# Patient Record
Sex: Female | Born: 1965 | Race: Black or African American | Hispanic: No | Smoking: Never smoker
Health system: Southern US, Community
[De-identification: ages and names within clinical notes are randomized; demographics above are authoritative.]

## PROBLEM LIST (undated history)

## (undated) DIAGNOSIS — I10 Essential (primary) hypertension: Secondary | ICD-10-CM

## (undated) HISTORY — PX: TUBAL LIGATION: SHX77

---

## 2008-04-18 ENCOUNTER — Ambulatory Visit: Payer: Self-pay | Admitting: Family Medicine

## 2011-05-26 ENCOUNTER — Ambulatory Visit: Payer: Self-pay | Admitting: Family Medicine

## 2011-06-09 ENCOUNTER — Ambulatory Visit: Payer: Self-pay | Admitting: Family Medicine

## 2013-03-13 ENCOUNTER — Ambulatory Visit: Payer: Self-pay | Admitting: Internal Medicine

## 2013-03-30 ENCOUNTER — Ambulatory Visit: Payer: Self-pay | Admitting: Internal Medicine

## 2013-04-11 ENCOUNTER — Ambulatory Visit: Payer: Self-pay | Admitting: Internal Medicine

## 2013-04-11 HISTORY — PX: BREAST CYST ASPIRATION: SHX578

## 2014-11-08 DIAGNOSIS — I10 Essential (primary) hypertension: Secondary | ICD-10-CM | POA: Insufficient documentation

## 2014-11-11 ENCOUNTER — Other Ambulatory Visit: Payer: Self-pay | Admitting: Internal Medicine

## 2014-11-11 DIAGNOSIS — Z1239 Encounter for other screening for malignant neoplasm of breast: Secondary | ICD-10-CM

## 2014-11-19 ENCOUNTER — Ambulatory Visit: Payer: Self-pay

## 2015-01-31 ENCOUNTER — Ambulatory Visit
Admission: RE | Admit: 2015-01-31 | Discharge: 2015-01-31 | Disposition: A | Discharge status: Home / Self Care | Payer: Self-pay | Source: Ambulatory Visit | Attending: Internal Medicine | Admitting: Internal Medicine

## 2015-01-31 DIAGNOSIS — Z1239 Encounter for other screening for malignant neoplasm of breast: Secondary | ICD-10-CM

## 2015-02-06 ENCOUNTER — Other Ambulatory Visit: Payer: Self-pay | Admitting: Internal Medicine

## 2015-02-06 DIAGNOSIS — N63 Unspecified lump in unspecified breast: Secondary | ICD-10-CM

## 2015-02-28 ENCOUNTER — Ambulatory Visit
Admission: RE | Admit: 2015-02-28 | Discharge: 2015-02-28 | Disposition: A | Discharge status: Home / Self Care | Payer: Self-pay | Source: Ambulatory Visit | Attending: Internal Medicine | Admitting: Internal Medicine

## 2015-02-28 DIAGNOSIS — N63 Unspecified lump in unspecified breast: Secondary | ICD-10-CM

## 2016-03-03 ENCOUNTER — Ambulatory Visit: Payer: Self-pay | Attending: Oncology | Admitting: *Deleted

## 2016-03-03 ENCOUNTER — Encounter: Payer: Self-pay | Admitting: *Deleted

## 2016-03-03 ENCOUNTER — Encounter (INDEPENDENT_AMBULATORY_CARE_PROVIDER_SITE_OTHER): Payer: Self-pay

## 2016-03-03 VITALS — BP 144/91 | HR 82 | Temp 97.8°F | Ht 66.14 in | Wt 194.4 lb

## 2016-03-03 DIAGNOSIS — N63 Unspecified lump in unspecified breast: Secondary | ICD-10-CM

## 2016-03-03 NOTE — Progress Notes (Signed)
Subjective:     Patient ID: Eileen Hill, female   DOB: 02/12/1966, 50 y.o.   MRN: [id number]  HPI   Review of Systems     Objective:   Physical Exam  Pulmonary/Chest: Right breast exhibits no inverted nipple, no mass, no nipple discharge, no skin change and no tenderness. Left breast exhibits mass. Left breast exhibits no inverted nipple, no nipple discharge, no skin change and no tenderness. Breasts are symmetrical.         Assessment:     50 year old Black female presents today for clinical breast exam, pap and mammogram.  Last pap on 11/12/14 was negative / negative, therefore explained that we could do a pelvic exam, but per guidelines, next pap smear will be due in 2021.  Patient refused pelvic exam, and states she will get a pap with her primary care provider.  Clinical breast exam revealed an asymmetric thickening at 1:00 left breast.  Patient states she has had a cyst aspirated there before.  Taught self breast awareness.  Patient has been screened for eligibility.  She does not have any insurance, Medicare or Medicaid.  She also meets financial eligibility.  Hand-out given on the Affordable Care Act.    Plan:     Bilateral diagnostic mammogram and ultrasound scheduled for Friday, March 05, 2016.  Will follow-up per [facility] protocol.

## 2016-03-03 NOTE — Patient Instructions (Signed)
Gave patient hand-out, Women Staying Healthy, Active and Well from [facility], with education on breast health, pap smears, heart and colon health. 

## 2016-03-05 ENCOUNTER — Ambulatory Visit
Admission: RE | Admit: 2016-03-05 | Discharge: 2016-03-05 | Disposition: A | Discharge status: Home / Self Care | Payer: Self-pay | Source: Ambulatory Visit | Attending: Oncology | Admitting: Oncology

## 2016-03-05 DIAGNOSIS — N63 Unspecified lump in unspecified breast: Secondary | ICD-10-CM

## 2016-03-11 ENCOUNTER — Telehealth: Payer: Self-pay | Admitting: *Deleted

## 2016-03-11 NOTE — Telephone Encounter (Signed)
Called and talked with patient today.  Discussed the benign findings on her mammogram.  Denies any pain.  Offered surgical consult for aspiration of the cysts if at anytime she starts to experience pain.  She is agreeable.  HSIS to [place].

## 2017-02-11 ENCOUNTER — Other Ambulatory Visit: Payer: Self-pay | Admitting: Internal Medicine

## 2017-02-11 DIAGNOSIS — Z1231 Encounter for screening mammogram for malignant neoplasm of breast: Secondary | ICD-10-CM

## 2017-03-11 ENCOUNTER — Ambulatory Visit
Admission: RE | Admit: 2017-03-11 | Discharge: 2017-03-11 | Disposition: A | Discharge status: Home / Self Care | Payer: BLUE CROSS/BLUE SHIELD | Source: Ambulatory Visit | Attending: Internal Medicine | Admitting: Internal Medicine

## 2017-03-11 DIAGNOSIS — Z1231 Encounter for screening mammogram for malignant neoplasm of breast: Secondary | ICD-10-CM | POA: Insufficient documentation

## 2017-06-15 ENCOUNTER — Other Ambulatory Visit: Payer: Self-pay | Admitting: Nurse Practitioner

## 2017-06-15 DIAGNOSIS — Z1239 Encounter for other screening for malignant neoplasm of breast: Secondary | ICD-10-CM

## 2017-06-20 ENCOUNTER — Telehealth: Payer: Self-pay | Admitting: Gastroenterology

## 2017-06-20 NOTE — Telephone Encounter (Signed)
Gastroenterology Pre-Procedure Review  Request Date:   Requesting Physician: Dr.    PATIENT REVIEW QUESTIONS: The patient responded to the following health history questions as indicated:    1. Are you having any GI issues? no 2. Do you have a personal history of Polyps? no 3. Do you have a family history of Colon Cancer or Polyps? yes (father) 4. Diabetes Mellitus? no 5. Joint replacements in the past 12 months?no 6. Major health problems in the past 3 months?no 7. Any artificial heart valves, MVP, or defibrillator?no    MEDICATIONS & ALLERGIES:    Patient reports the following regarding taking any anticoagulation/antiplatelet therapy:   Plavix, Coumadin, Eliquis, Xarelto, Lovenox, Pradaxa, Brilinta, or Effient? no Aspirin? no  Patient confirms/reports the following medications:  No current outpatient medications on file.   No current facility-administered medications for this visit.     Patient confirms/reports the following allergies:  No Known Allergies  No orders of the defined types were placed in this encounter.   AUTHORIZATION INFORMATION Primary Insurance: 1D#: Group #:  Secondary Insurance: 1D#: Group #:  SCHEDULE INFORMATION: Date:  Time: Location:

## 2017-06-23 ENCOUNTER — Other Ambulatory Visit: Payer: Self-pay

## 2017-06-23 DIAGNOSIS — Z1211 Encounter for screening for malignant neoplasm of colon: Secondary | ICD-10-CM

## 2017-06-23 MED ORDER — PEG 3350-KCL-NA BICARB-NACL 420 G PO SOLR: 4000.0000 mL | Freq: Once | ORAL | 0 refills | Status: AC

## 2018-01-05 ENCOUNTER — Encounter: Payer: Self-pay | Admitting: *Deleted

## 2018-01-06 ENCOUNTER — Ambulatory Visit: Payer: BLUE CROSS/BLUE SHIELD | Admitting: Anesthesiology

## 2018-01-06 ENCOUNTER — Encounter: Payer: Self-pay | Admitting: *Deleted

## 2018-01-06 ENCOUNTER — Ambulatory Visit
Admission: RE | Admit: 2018-01-06 | Discharge: 2018-01-06 | Disposition: A | Discharge status: Home / Self Care | Payer: BLUE CROSS/BLUE SHIELD | Source: Ambulatory Visit | Attending: Gastroenterology | Admitting: Gastroenterology

## 2018-01-06 ENCOUNTER — Encounter
Admission: RE | Disposition: A | Discharge status: Home / Self Care | Payer: Self-pay | Source: Ambulatory Visit | Attending: Gastroenterology

## 2018-01-06 DIAGNOSIS — Z1211 Encounter for screening for malignant neoplasm of colon: Secondary | ICD-10-CM | POA: Insufficient documentation

## 2018-01-06 DIAGNOSIS — K219 Gastro-esophageal reflux disease without esophagitis: Secondary | ICD-10-CM | POA: Insufficient documentation

## 2018-01-06 DIAGNOSIS — Z79899 Other long term (current) drug therapy: Secondary | ICD-10-CM | POA: Diagnosis not present

## 2018-01-06 DIAGNOSIS — Z882 Allergy status to sulfonamides status: Secondary | ICD-10-CM | POA: Diagnosis not present

## 2018-01-06 DIAGNOSIS — I10 Essential (primary) hypertension: Secondary | ICD-10-CM | POA: Diagnosis not present

## 2018-01-06 HISTORY — PX: COLONOSCOPY WITH PROPOFOL: SHX5780

## 2018-01-06 HISTORY — DX: Essential (primary) hypertension: I10

## 2018-01-06 LAB — POCT PREGNANCY, URINE: PREG TEST UR: NEGATIVE

## 2018-01-06 SURGERY — COLONOSCOPY WITH PROPOFOL
Anesthesia: General

## 2018-01-06 MED ORDER — PROPOFOL 10 MG/ML IV BOLUS
INTRAVENOUS | Status: DC | PRN
  Administered 2018-01-06: 10 mg via INTRAVENOUS
  Administered 2018-01-06: 30 mg via INTRAVENOUS
  Administered 2018-01-06: 90 mg via INTRAVENOUS
  Administered 2018-01-06: 30 mg via INTRAVENOUS

## 2018-01-06 MED ORDER — PROPOFOL 500 MG/50ML IV EMUL
INTRAVENOUS | Status: DC | PRN
  Administered 2018-01-06: 140 ug/kg/min via INTRAVENOUS

## 2018-01-06 MED ORDER — PROPOFOL 10 MG/ML IV BOLUS
INTRAVENOUS | Status: AC
  Filled 2018-01-06: qty 20

## 2018-01-06 MED ORDER — SODIUM CHLORIDE 0.9 % IV SOLN
INTRAVENOUS | Status: DC
  Administered 2018-01-06: 1000 mL via INTRAVENOUS

## 2018-01-06 MED ORDER — PHENYLEPHRINE HCL 10 MG/ML IJ SOLN
INTRAMUSCULAR | Status: AC
  Filled 2018-01-06: qty 1

## 2018-01-06 NOTE — Anesthesia Post-op Follow-up Note (Signed)
Anesthesia QCDR form completed.        

## 2018-01-06 NOTE — Anesthesia Postprocedure Evaluation (Signed)
Anesthesia Post Note  Patient: BRIDGET Attica  Procedure(s) Performed: COLONOSCOPY WITH PROPOFOL (N/A )  Patient location during evaluation: Endoscopy Anesthesia Type: General Level of consciousness: awake and alert Pain management: pain level controlled Vital Signs Assessment: post-procedure vital signs reviewed and stable Respiratory status: spontaneous breathing, nonlabored ventilation, respiratory function stable and patient connected to nasal cannula oxygen Cardiovascular status: blood pressure returned to baseline and stable Postop Assessment: no apparent nausea or vomiting Anesthetic complications: no     Last Vitals:  Vitals:   01/06/18 0856 01/06/18 0935  BP: 110/69 130/90  Pulse:  68  Resp:  16  Temp: 36.4 C   SpO2:  100%    Last Pain:  Vitals:   01/06/18 0935  TempSrc:   PainSc: 0-No pain                 Cleda Mccreedy Piscitello

## 2018-01-06 NOTE — H&P (Signed)
Melodie Bouillon, MD [address], [address], [place], [place], [address] [address], [address], [place], [place], [address] Phone: [phone]  Fax: [phone]  Primary Care Physician:  Martie Round, NP   Pre-Procedure History & Physical: HPI:  Eileen Hill is a 52 y.o. female is here for a colonoscopy.   Past Medical History:  Diagnosis Date  . Hypertension     Past Surgical History:  Procedure Laterality Date  . BREAST CYST ASPIRATION Left 04/11/2013   2:00 area  . TUBAL LIGATION      Prior to Admission medications   Medication Sig Start Date End Date Taking? Authorizing Provider  esomeprazole (NEXIUM) 20 MG capsule Take 20 mg by mouth daily at 12 noon.   Yes [provider]  verapamil (CALAN-SR) 240 MG CR tablet Take by mouth. 09/03/16   [provider]    Allergies as of 06/24/2017 - Review Complete 01/31/2015  Allergen Reaction Noted  . Sulfa antibiotics Nausea And Vomiting and Nausea Only 04/16/2014    Family History  Problem Relation Age of Onset  . Breast cancer Neg Hx     Social History   Socioeconomic History  . Marital status: Married    Spouse name: Not on file  . Number of children: Not on file  . Years of education: Not on file  . Highest education level: Not on file  Occupational History  . Not on file  Social Needs  . Financial resource strain: Not on file  . Food insecurity:    Worry: Not on file    Inability: Not on file  . Transportation needs:    Medical: Not on file    Non-medical: Not on file  Tobacco Use  . Smoking status: Never Smoker  . Smokeless tobacco: Never Used  Substance and Sexual Activity  . Alcohol use: Not on file  . Drug use: Not on file  . Sexual activity: Not on file  Lifestyle  . Physical activity:    Days per week: Not on file    Minutes per session: Not on file  . Stress: Not on file  Relationships  . Social connections:    Talks on phone: Not on file    Gets together: Not  on file    Attends religious service: Not on file    Active member of club or organization: Not on file    Attends meetings of clubs or organizations: Not on file    Relationship status: Not on file  . Intimate partner violence:    Fear of current or ex partner: Not on file    Emotionally abused: Not on file    Physically abused: Not on file    Forced sexual activity: Not on file  Other Topics Concern  . Not on file  Social History Narrative  . Not on file    Review of Systems: See HPI, otherwise negative ROS  Physical Exam: BP (!) 143/92   Pulse 63   Temp 97.7 F (36.5 C) (Tympanic)   Resp 18   SpO2 100%  General:   Alert,  pleasant and cooperative in NAD Head:  Normocephalic and atraumatic. Neck:  Supple; no masses or thyromegaly. Lungs:  Clear throughout to auscultation, normal respiratory effort.    Heart:  +S1, +S2, Regular rate and rhythm, No edema. Abdomen:  Soft, nontender and nondistended. Normal bowel sounds, without guarding, and without rebound.   Neurologic:  Alert and  oriented x4;  grossly normal neurologically.  Impression/Plan:  Eileen Hill is here for a colonoscopy to be performed for average risk screening.  Risks, benefits, limitations, and alternatives regarding  colonoscopy have been reviewed with the patient.  Questions have been answered.  All parties agreeable.   Pasty Spillers, MD  01/06/2018, 8:05 AM

## 2018-01-06 NOTE — Anesthesia Preprocedure Evaluation (Signed)
Anesthesia Evaluation  Patient identified by MRN, date of birth, ID band Patient awake    Reviewed: Allergy & Precautions, H&P , NPO status , Patient's Chart, lab work & pertinent test results  History of Anesthesia Complications Negative for: history of anesthetic complications  Airway Mallampati: II  TM Distance: >3 FB Neck ROM: full    Dental  (+) Chipped   Pulmonary neg pulmonary ROS, neg shortness of breath,           Cardiovascular hypertension, (-) angina(-) Past MI and (-) DOE      Neuro/Psych negative neurological ROS  negative psych ROS   GI/Hepatic Neg liver ROS, GERD  Medicated and Controlled,  Endo/Other  negative endocrine ROS  Renal/GU negative Renal ROS  negative genitourinary   Musculoskeletal   Abdominal   Peds  Hematology negative hematology ROS (+)   Anesthesia Other Findings Past Medical History: No date: Hypertension  Past Surgical History: 04/11/2013: BREAST CYST ASPIRATION; Left     Comment:  2:00 area No date: TUBAL LIGATION     Reproductive/Obstetrics negative OB ROS                             Anesthesia Physical Anesthesia Plan  ASA: III  Anesthesia Plan: General   Post-op Pain Management:    Induction: Intravenous  PONV Risk Score and Plan: Propofol infusion and TIVA  Airway Management Planned: Natural Airway and Nasal Cannula  Additional Equipment:   Intra-op Plan:   Post-operative Plan:   Informed Consent: I have reviewed the patients History and Physical, chart, labs and discussed the procedure including the risks, benefits and alternatives for the proposed anesthesia with the patient or authorized representative who has indicated his/her understanding and acceptance.   Dental Advisory Given  Plan Discussed with: Anesthesiologist, CRNA and Surgeon  Anesthesia Plan Comments: (Patient consented for risks of anesthesia including but not  limited to:  - adverse reactions to medications - risk of intubation if required - damage to teeth, lips or other oral mucosa - sore throat or hoarseness - Damage to heart, brain, lungs or loss of life  Patient voiced understanding.)        Anesthesia Quick Evaluation

## 2018-01-06 NOTE — Transfer of Care (Signed)
Immediate Anesthesia Transfer of Care Note  Patient: Eileen Hill  Procedure(s) Performed: COLONOSCOPY WITH PROPOFOL (N/A )  Patient Location: Endoscopy Unit  Anesthesia Type:General  Level of Consciousness: sedated  Airway & Oxygen Therapy: Patient Spontanous Breathing and Patient connected to nasal cannula oxygen  Post-op Assessment: Report given to RN and Post -op Vital signs reviewed and stable  Post vital signs: Reviewed and stable  Last Vitals:  Vitals Value Taken Time  BP    Temp    Pulse    Resp    SpO2 98 % 01/06/2018  8:55 AM    Last Pain:  Vitals:   01/06/18 0759  TempSrc: Tympanic  PainSc: 0-No pain         Complications: No apparent anesthesia complications

## 2018-01-06 NOTE — Op Note (Signed)
[facility] Gastroenterology Patient Name: Toniette Devera Procedure Date: 01/06/2018 7:08 AM MRN: [id number] Account #: [id number] Date of Birth: [birth year] Admit Type: Outpatient Age: 52 Room: [facility] ENDO ROOM 2 Gender: Female Note Status: Finalized Procedure:            Colonoscopy Indications:          Screening for colorectal malignant neoplasm Providers:            Dearra Myhand B. Maximino Greenland MD, MD Referring MD:         Clinic [facility], MD (Referring MD) Medicines:            Monitored Anesthesia Care Complications:        No immediate complications. Procedure:            Pre-Anesthesia Assessment:                       - Prior to the procedure, a History and Physical was                        performed, and patient medications, allergies and                        sensitivities were reviewed. The patient's tolerance of                        previous anesthesia was reviewed.                       - The risks and benefits of the procedure and the                        sedation options and risks were discussed with the                        patient. All questions were answered and informed                        consent was obtained.                       - Patient identification and proposed procedure were                        verified prior to the procedure by the physician, the                        nurse, the anesthetist and the technician. The                        procedure was verified in the pre-procedure area in the                        procedure room in the endoscopy suite.                       - ASA Grade Assessment: II - A patient with mild                        systemic disease.                       -  After reviewing the risks and benefits, the patient                        was deemed in satisfactory condition to undergo the                        procedure.                       After obtaining informed consent, the  colonoscope was                        passed under direct vision. Throughout the procedure,                        the patient's blood pressure, pulse, and oxygen                        saturations were monitored continuously. The                        Colonoscope was introduced through the anus and                        advanced to the the cecum, identified by appendiceal                        orifice and ileocecal valve. The colonoscopy was                        performed with ease. The patient tolerated the                        procedure well. The quality of the bowel preparation                        was fair except the sigmoid colon was poor. Water and                        suctioning used to clean the colon as much as possible.                        Solid stool could not be suctioned for visualization in                        some areas. Findings:      The perianal and digital rectal examinations were normal.      The rectum, sigmoid colon, descending colon, transverse colon, ascending       colon and cecum appeared normal.      The retroflexed view of the distal rectum and anal verge was normal and       showed no anal or rectal abnormalities. Impression:           - The rectum, sigmoid colon, descending colon,                        transverse colon, ascending colon and cecum are normal.                       - The distal rectum  and anal verge are normal on                        retroflexion view.                       - No specimens collected.                       - Due to the prep, this was not a satisfactory exam for                        colorectal cancer screening, that is evaluation for                        small or flat lesions or polyps. Patient should follow                        up in clinic after discharge to discuss need for future                        colonoscopy and colorectal cancer screening. Recommendation:       - Discharge patient to home.                        - Resume previous diet.                       - Continue present medications.                       - Repeat colonoscopy in 1 year due to fair to poor prep                        (Patient could not finish her entire prep due to                        nausea. Would recommend Suprep or slow prep for next                        procedure).                       - Return to primary care physician as previously                        scheduled.                       - The findings and recommendations were discussed with                        the patient.                       - The findings and recommendations were discussed with                        the patient's family. Procedure Code(s):    --- Professional ---                       Z6109, Colorectal cancer screening;  colonoscopy on                        individual not meeting criteria for high risk Diagnosis Code(s):    --- Professional ---                       Z12.11, Encounter for screening for malignant neoplasm                        of colon CPT copyright 2018 American Medical Association. All rights reserved. The codes documented in this report are preliminary and upon coder review may  be revised to meet current compliance requirements.  Melodie Bouillon, MD Michel Bickers B. Maximino Greenland MD, MD 01/06/2018 9:00:13 AM This report has been signed electronically. Number of Addenda: 0 Note Initiated On: 01/06/2018 7:08 AM Scope Withdrawal Time: 0 hours 21 minutes 46 seconds  Total Procedure Duration: 0 hours 30 minutes 53 seconds       [facility]

## 2018-03-20 ENCOUNTER — Ambulatory Visit
Admission: RE | Admit: 2018-03-20 | Discharge: 2018-03-20 | Disposition: A | Discharge status: Home / Self Care | Payer: BLUE CROSS/BLUE SHIELD | Source: Ambulatory Visit | Attending: Nurse Practitioner | Admitting: Nurse Practitioner

## 2018-03-20 DIAGNOSIS — Z1239 Encounter for other screening for malignant neoplasm of breast: Secondary | ICD-10-CM | POA: Insufficient documentation

## 2018-03-24 ENCOUNTER — Other Ambulatory Visit: Payer: Self-pay | Admitting: Nurse Practitioner

## 2018-03-24 DIAGNOSIS — N6489 Other specified disorders of breast: Secondary | ICD-10-CM

## 2018-03-24 DIAGNOSIS — R928 Other abnormal and inconclusive findings on diagnostic imaging of breast: Secondary | ICD-10-CM

## 2018-03-31 ENCOUNTER — Ambulatory Visit
Admission: RE | Admit: 2018-03-31 | Discharge: 2018-03-31 | Disposition: A | Discharge status: Home / Self Care | Payer: BLUE CROSS/BLUE SHIELD | Source: Ambulatory Visit | Attending: Nurse Practitioner | Admitting: Nurse Practitioner

## 2018-03-31 DIAGNOSIS — R928 Other abnormal and inconclusive findings on diagnostic imaging of breast: Secondary | ICD-10-CM

## 2018-03-31 DIAGNOSIS — N6489 Other specified disorders of breast: Secondary | ICD-10-CM | POA: Diagnosis present

## 2019-04-06 ENCOUNTER — Other Ambulatory Visit: Payer: Self-pay | Admitting: Family Medicine

## 2019-04-06 DIAGNOSIS — Z1231 Encounter for screening mammogram for malignant neoplasm of breast: Secondary | ICD-10-CM

## 2019-05-04 ENCOUNTER — Encounter: Payer: Self-pay | Admitting: Radiology

## 2019-05-04 ENCOUNTER — Ambulatory Visit
Admission: RE | Admit: 2019-05-04 | Discharge: 2019-05-04 | Disposition: A | Discharge status: Home / Self Care | Payer: BLUE CROSS/BLUE SHIELD | Source: Ambulatory Visit | Attending: Family Medicine | Admitting: Family Medicine

## 2019-05-04 DIAGNOSIS — Z1231 Encounter for screening mammogram for malignant neoplasm of breast: Secondary | ICD-10-CM | POA: Insufficient documentation

## 2019-05-07 ENCOUNTER — Other Ambulatory Visit: Payer: Self-pay | Admitting: Family Medicine

## 2019-05-07 DIAGNOSIS — R928 Other abnormal and inconclusive findings on diagnostic imaging of breast: Secondary | ICD-10-CM

## 2019-05-11 ENCOUNTER — Ambulatory Visit
Admission: RE | Admit: 2019-05-11 | Discharge: 2019-05-11 | Disposition: A | Discharge status: Home / Self Care | Payer: BLUE CROSS/BLUE SHIELD | Source: Ambulatory Visit | Attending: Family Medicine | Admitting: Family Medicine

## 2019-05-11 DIAGNOSIS — R928 Other abnormal and inconclusive findings on diagnostic imaging of breast: Secondary | ICD-10-CM | POA: Insufficient documentation

## 2019-05-15 ENCOUNTER — Other Ambulatory Visit: Payer: Self-pay | Admitting: Family Medicine

## 2019-05-15 DIAGNOSIS — R928 Other abnormal and inconclusive findings on diagnostic imaging of breast: Secondary | ICD-10-CM

## 2019-05-15 DIAGNOSIS — N6320 Unspecified lump in the left breast, unspecified quadrant: Secondary | ICD-10-CM

## 2019-05-25 ENCOUNTER — Ambulatory Visit
Admission: RE | Admit: 2019-05-25 | Discharge: 2019-05-25 | Disposition: A | Discharge status: Home / Self Care | Payer: BLUE CROSS/BLUE SHIELD | Source: Ambulatory Visit | Attending: Family Medicine | Admitting: Family Medicine

## 2019-05-25 DIAGNOSIS — N6320 Unspecified lump in the left breast, unspecified quadrant: Secondary | ICD-10-CM

## 2019-05-25 DIAGNOSIS — R928 Other abnormal and inconclusive findings on diagnostic imaging of breast: Secondary | ICD-10-CM

## 2019-05-25 HISTORY — PX: BREAST BIOPSY: SHX20

## 2019-05-28 LAB — SURGICAL PATHOLOGY

## 2019-10-13 IMAGING — MG DIGITAL DIAGNOSTIC UNILATERAL LEFT MAMMOGRAM WITH TOMO AND CAD
6 series · 6 of 18 positions shown · non-contrast
Comparison: Previous exam(s).

CLINICAL DATA: Left medial breast asymmetry seen on most recent
screening mammography.

EXAM:
DIGITAL DIAGNOSTIC LEFT MAMMOGRAM WITH CAD AND TOMO
ULTRASOUND LEFT BREAST

[L CC synth-2D (1 of 2)]
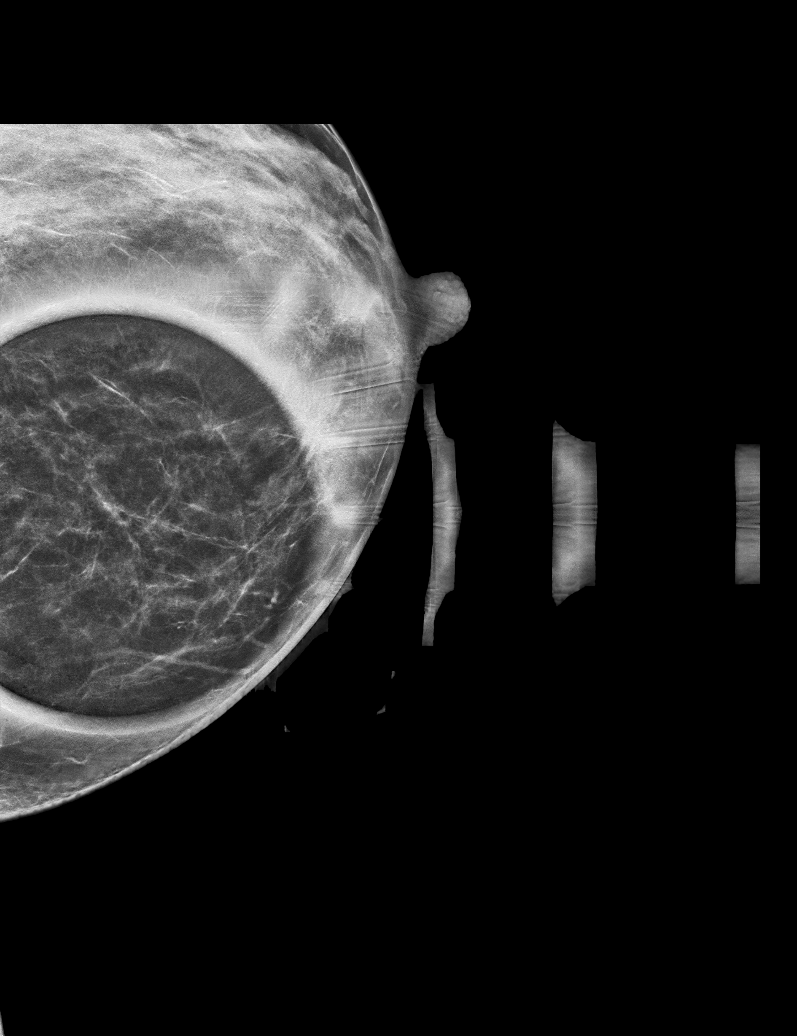

[L CC synth-2D (2 of 2)]
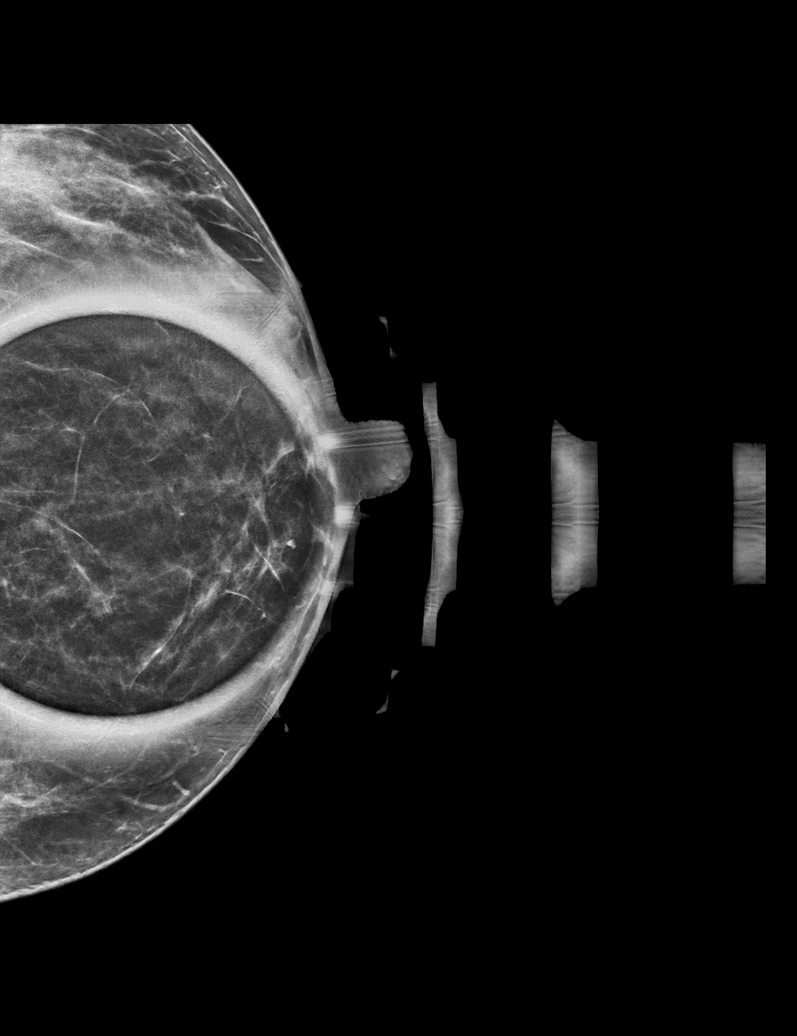

[L MLO synth-2D]
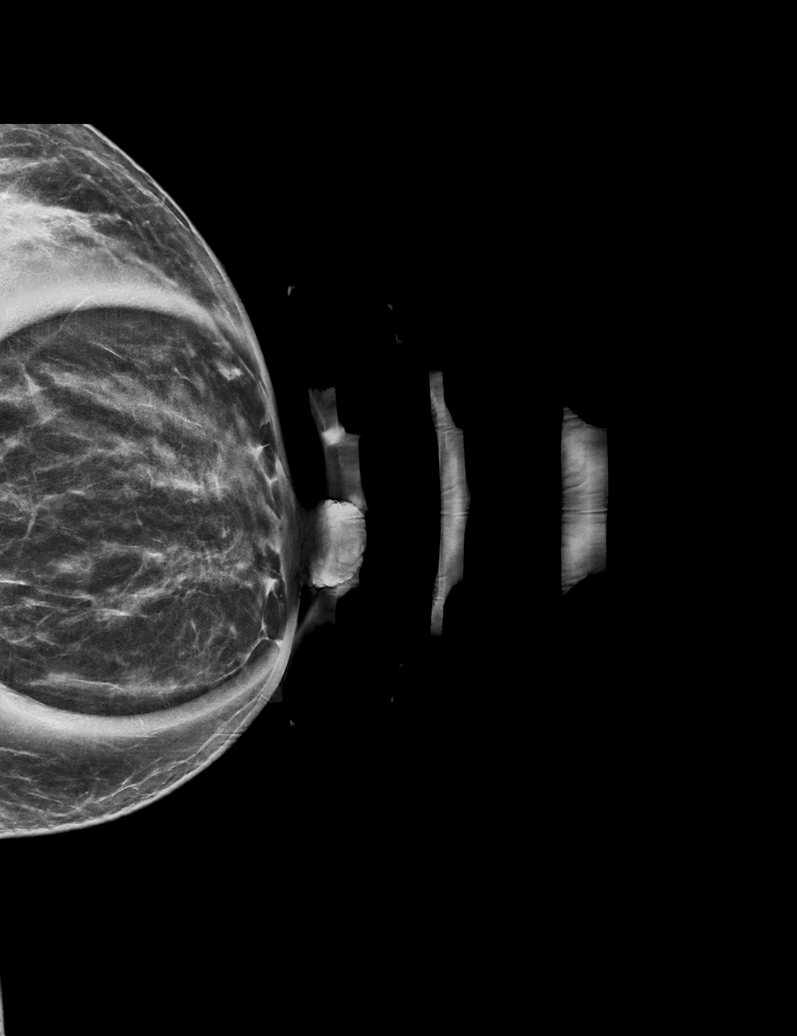

[L MLO tomo · tomo slice 31/62.0]
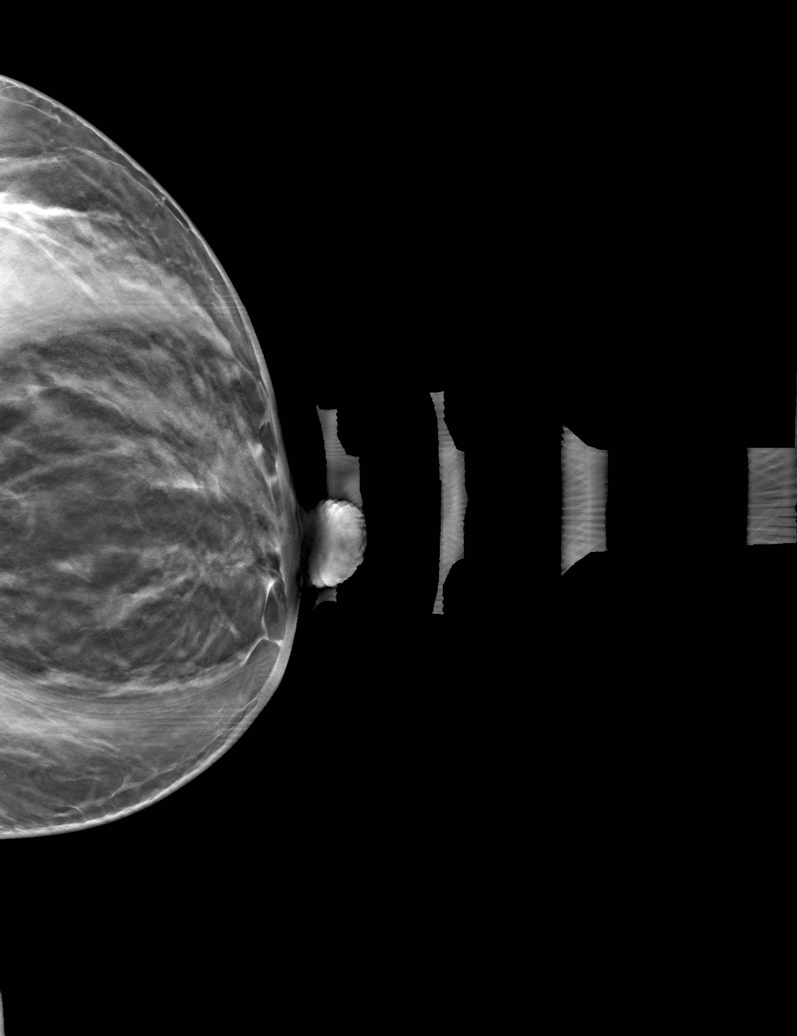

[L CC tomo (1 of 2) · tomo slice 27/54.0]
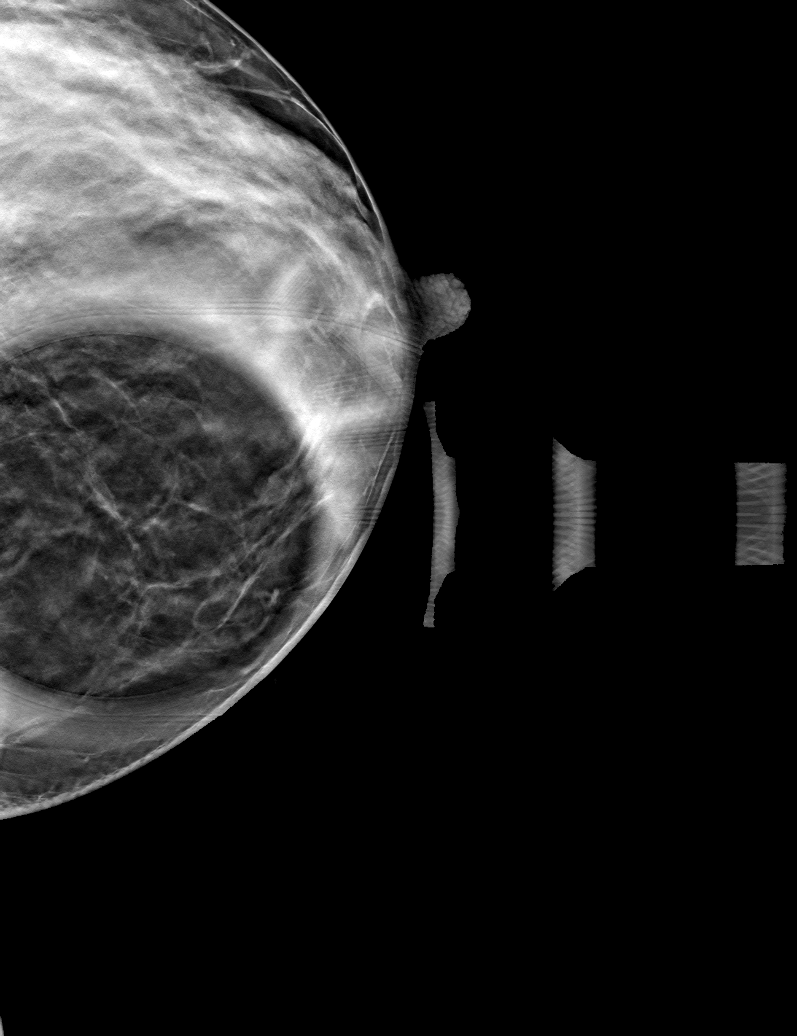

[L CC tomo (2 of 2) · tomo slice 27/53.0]
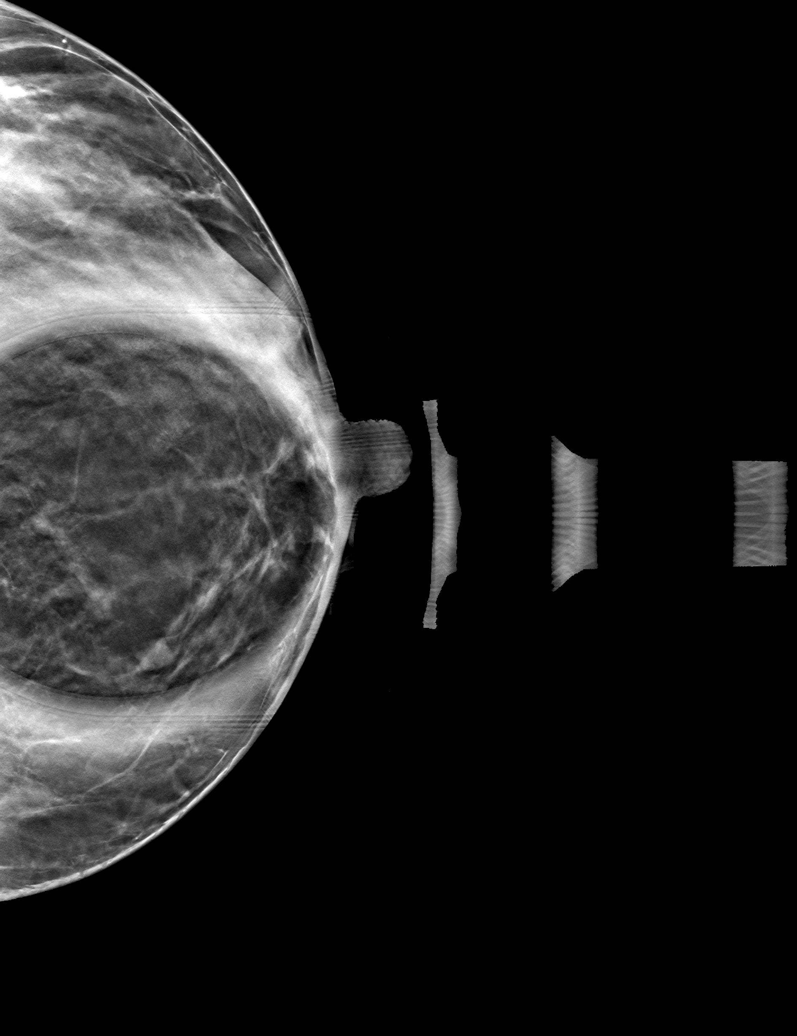

[6 of 18 positions shown; findings below may reference images not displayed]

ACR Breast Density Category c: The breast tissue is heterogeneously
dense, which may obscure small masses.
FINDINGS: Additional mammographic views of the left breast demonstrate a
persistent benign-appearing circumscribed few mm mass in the
slightly upper inner left breast, anterior depth.

Mammographic images were processed with CAD.

On physical exam, no suspicious masses are palpated.

Targeted ultrasound is performed, showing left breast 9:30 o'clock 3
cm from the nipple benign-appearing hypoechoic circumscribed
septated mass with appearance suggestive of a complicated cyst. It
measures 0.8 x 0.4 x 0.6 cm. This finding corresponds to the
mammographically seen mass.
IMPRESSION: Post left breast benign-appearing complicated cyst.

No mammographic or sonographic evidence of malignancy.

RECOMMENDATION:
Screening mammogram in one year.(Code:UZ-6-OP0)

I have discussed the findings and recommendations with the patient.
Results were also provided in writing at the conclusion of the
visit. If applicable, a reminder letter will be sent to the patient
regarding the next appointment.

BI-RADS CATEGORY  2: Benign.

## 2020-08-05 ENCOUNTER — Other Ambulatory Visit: Payer: Self-pay | Admitting: Family Medicine

## 2020-08-05 DIAGNOSIS — R928 Other abnormal and inconclusive findings on diagnostic imaging of breast: Secondary | ICD-10-CM

## 2020-08-07 ENCOUNTER — Other Ambulatory Visit: Payer: Self-pay | Admitting: Family Medicine

## 2020-08-07 DIAGNOSIS — R928 Other abnormal and inconclusive findings on diagnostic imaging of breast: Secondary | ICD-10-CM

## 2020-08-22 ENCOUNTER — Ambulatory Visit
Admission: RE | Admit: 2020-08-22 | Discharge: 2020-08-22 | Disposition: A | Discharge status: Home / Self Care | Payer: BC Managed Care – PPO | Source: Ambulatory Visit | Attending: Family Medicine | Admitting: Family Medicine

## 2020-08-22 ENCOUNTER — Other Ambulatory Visit: Payer: Self-pay

## 2020-08-22 ENCOUNTER — Other Ambulatory Visit: Payer: Self-pay | Admitting: Family Medicine

## 2020-08-22 DIAGNOSIS — R928 Other abnormal and inconclusive findings on diagnostic imaging of breast: Secondary | ICD-10-CM

## 2020-08-22 DIAGNOSIS — N6310 Unspecified lump in the right breast, unspecified quadrant: Secondary | ICD-10-CM

## 2020-11-22 IMAGING — US US BREAST*L* LIMITED INC AXILLA
1 series · 6 of 6 positions shown · non-contrast
Comparison: Previous exam(s).

CLINICAL DATA: 53-year-old female presenting as a recall from
screening for a possible left breast mass.

EXAM:
DIGITAL DIAGNOSTIC LEFT MAMMOGRAM WITH TOMO
ULTRASOUND LEFT BREAST

[Series 1: us breast*left* limited inc axilla · 0.06mm/px · 6 of 6 slices shown]
[im 1/6]
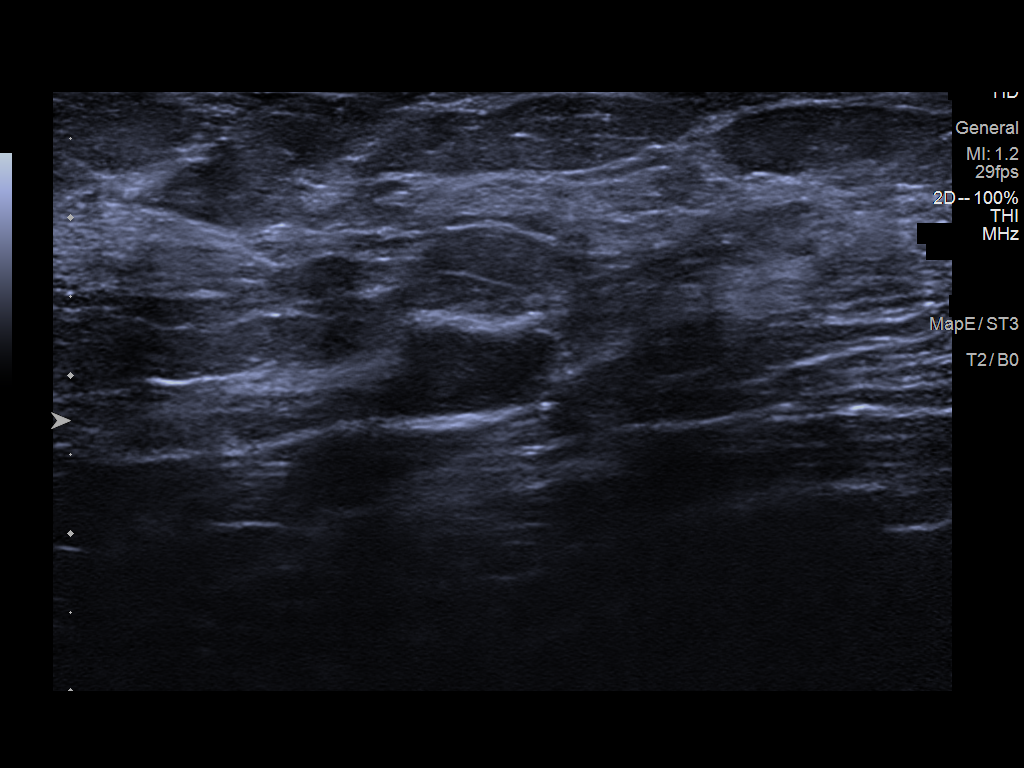
[im 2/6]
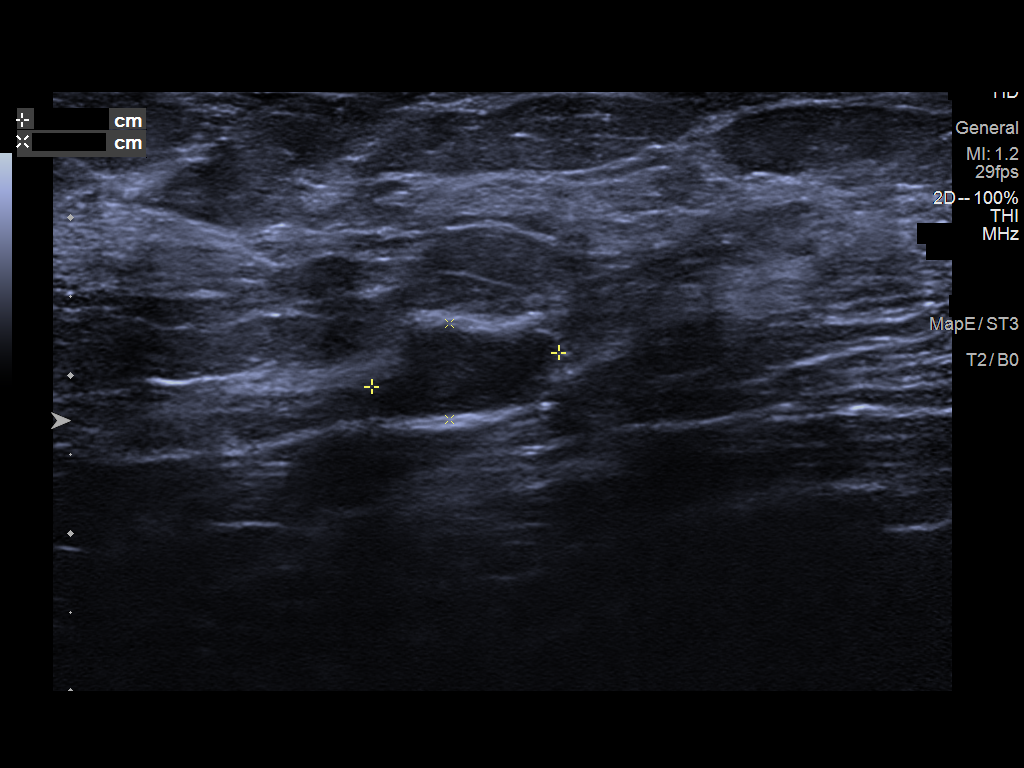
[im 3/6]
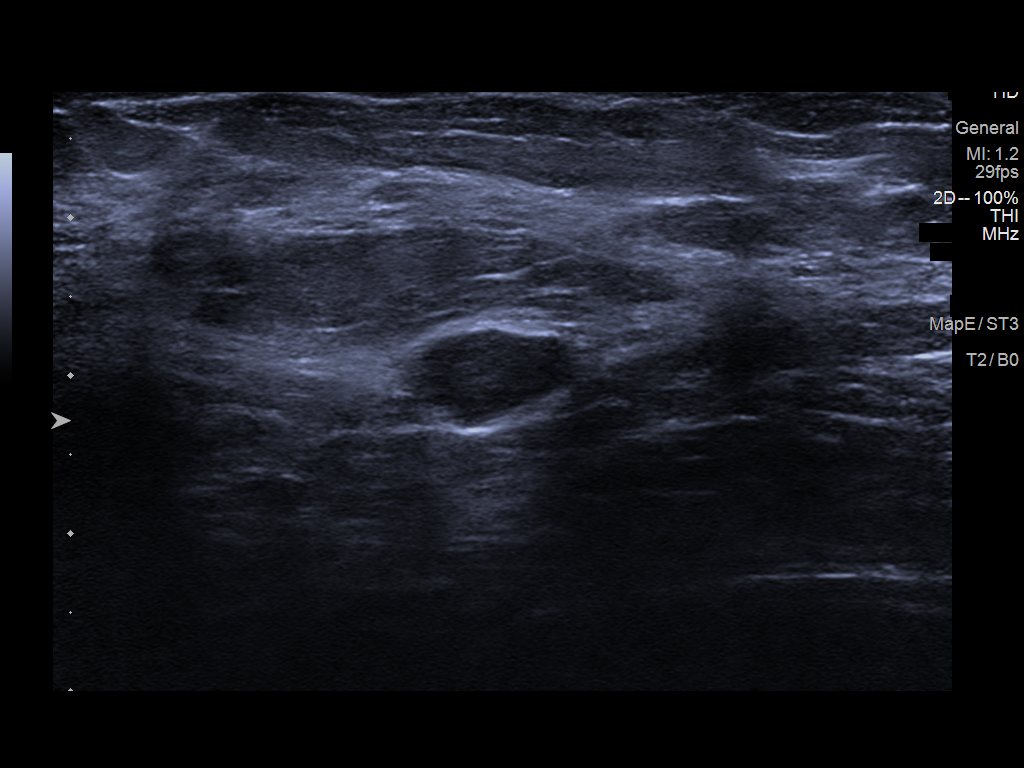
[im 4/6]
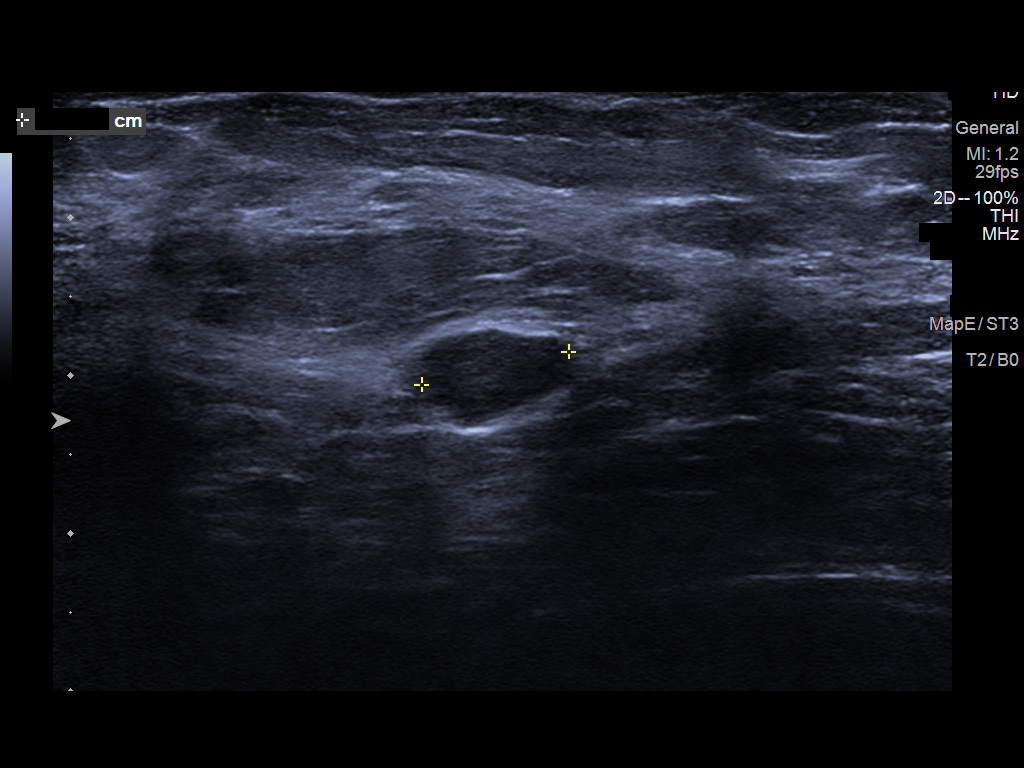
[im 5/6]
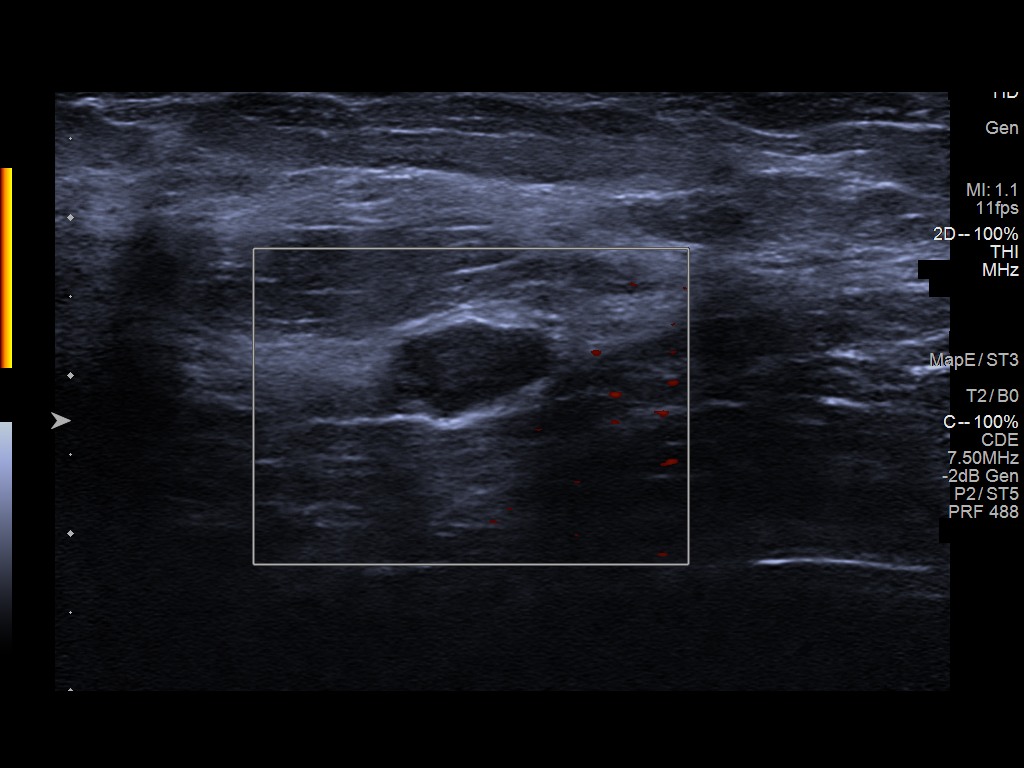
[im 6/6]
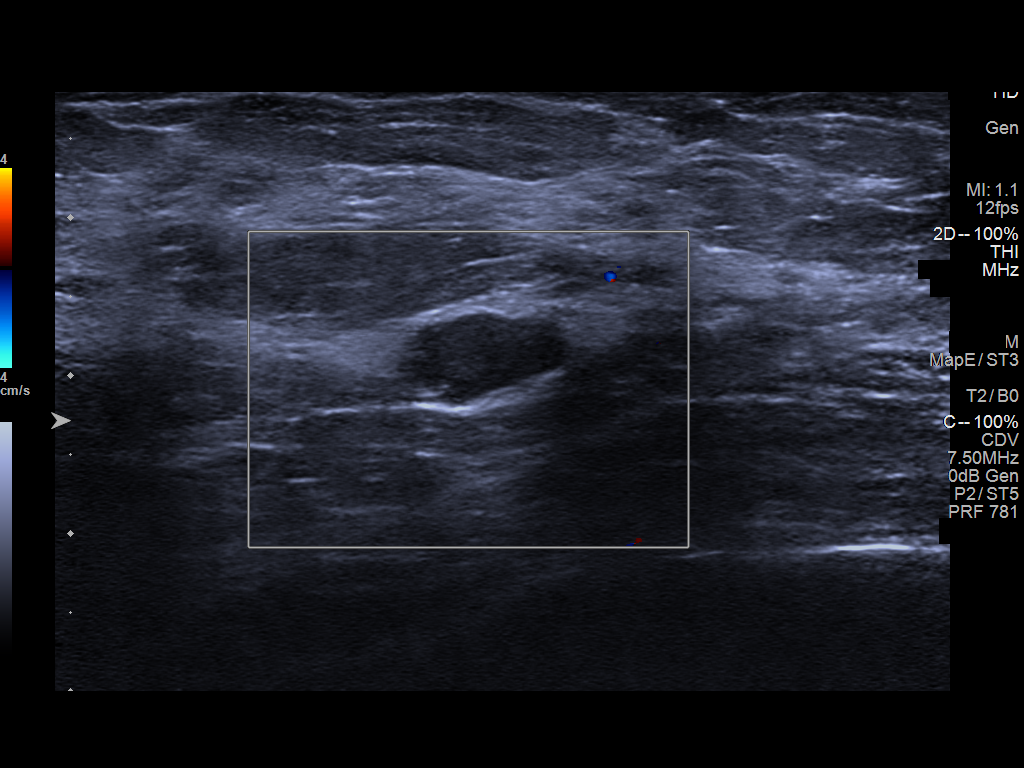

[6 of 6 positions shown; findings below may reference images not displayed]

ACR Breast Density Category c: The breast tissue is heterogeneously
dense, which may obscure small masses.
FINDINGS: Mammogram:

Additional spot compression and full field tomosynthesis views were
performed for the questioned mass in the far posterior outer left
breast. On the additional imaging there is persistence of an oval
mass measuring approximately 1.1 cm. No new findings identified in
the left breast.

Ultrasound:

Targeted ultrasound is performed in the left breast at 3 o'clock 6
cm from the nipple demonstrating an oval hypoechoic mass with
indistinct margins measuring 1.2 x 0.6 x 1.0 cm. No internal blood
flow identified. This corresponds to the mammographic finding.

Targeted ultrasound was performed of the left axillary lymph nodes
demonstrating multiple lymph nodes with cortical thickening and
rounded appearance, however the images appear to have not saved when
later reviewed.
IMPRESSION: 1. Left breast mass at 3 o'clock measuring 1.2 cm is indeterminate.
Tissue sampling is recommended.

2. Multiple mildly enlarged left axillary lymph nodes. These may be
reactive secondary to recent COVID vaccination.

RECOMMENDATION:
1. Ultrasound-guided core needle biopsy of the left breast mass at 3
o'clock.

2. Retake images of the enlarged left axillary lymph nodes at the
time of core needle biopsy.

3. If the mass at 3 o'clock is benign, recommend short-term
follow-up ultrasound in 3 months to evaluate the left axillary lymph
nodes. If the mass at 3 o'clock demonstrates atypical or malignant
pathology, recommend biopsy of one of the enlarged left axillary
lymph nodes.

I have discussed the findings and recommendations with the patient.
If applicable, a reminder letter will be sent to the patient
regarding the next appointment.

BI-RADS CATEGORY  4: Suspicious.

## 2020-12-06 IMAGING — MG US BREAST BX W LOC DEV 1ST LESION IMG BX SPEC US GUIDE*L*
1 series · 8 of 8 positions shown · non-contrast
Comparison: Previous exam(s).
COMPARISON: Previous exam(s).

Addendum:
CLINICAL DATA: Biopsy of a 3 o'clock left breast mass

EXAM:
ULTRASOUND GUIDED LEFT BREAST CORE NEEDLE BIOPSY

[Series 1: MG view · 0.09mm/px · 8 of 12 slices shown]
[im 1/12]
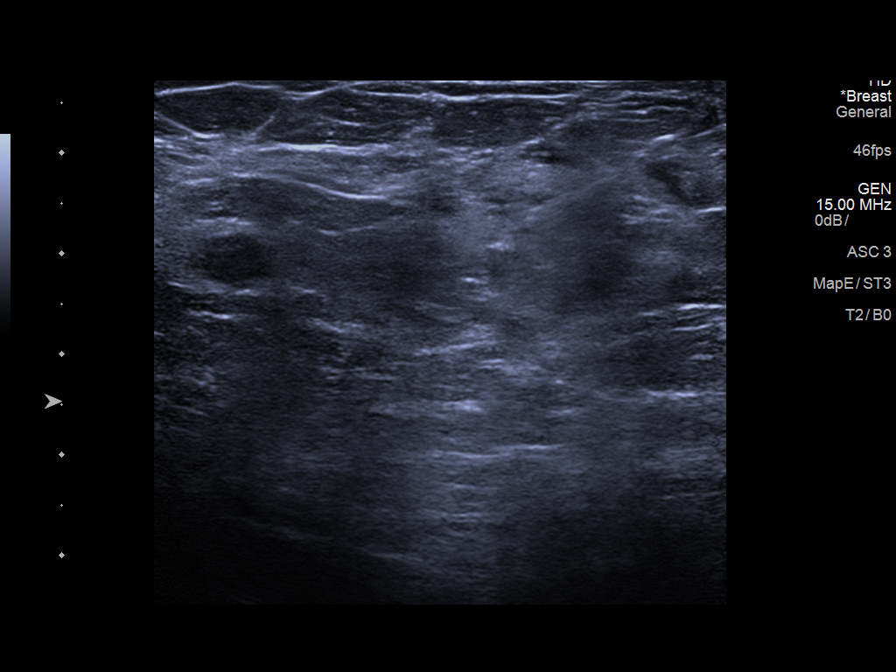
[im 2/12]
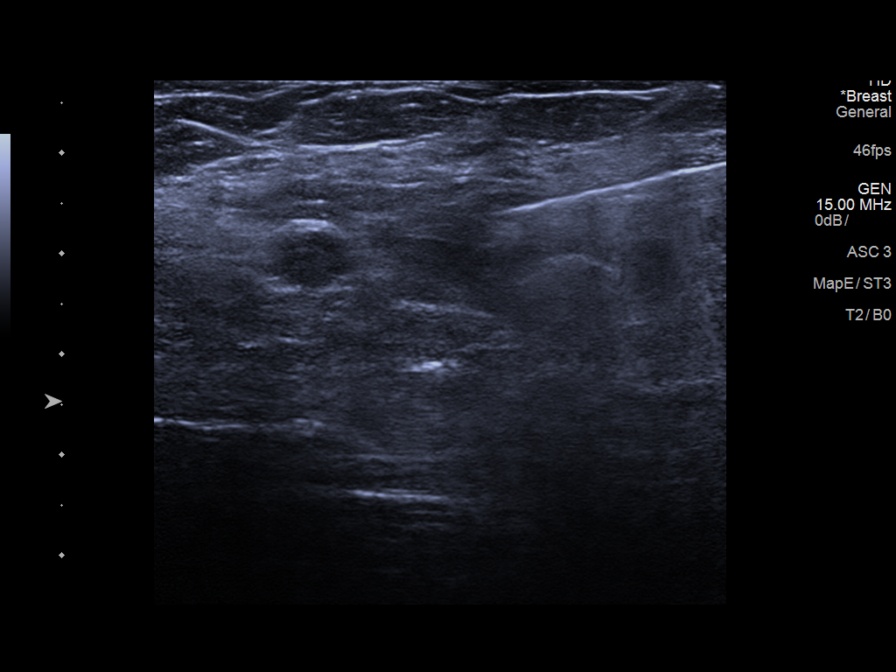
[im 4/12]
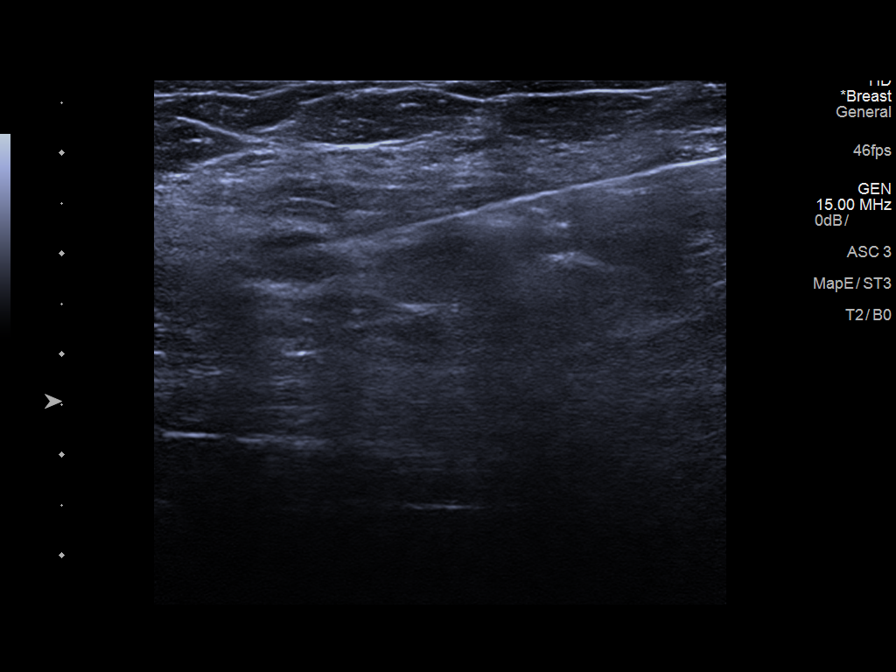
[im 5/12]
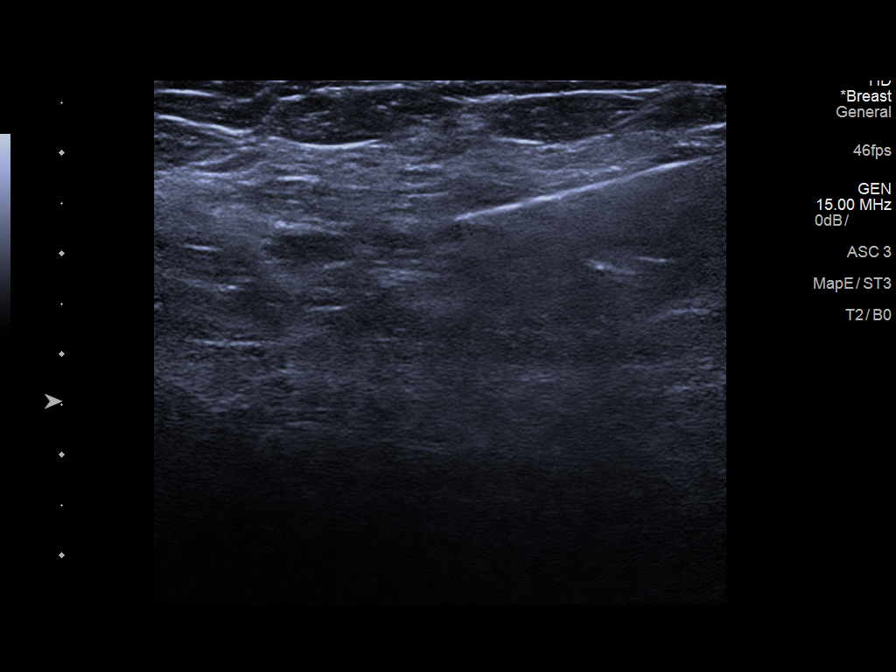
[im 7/12]
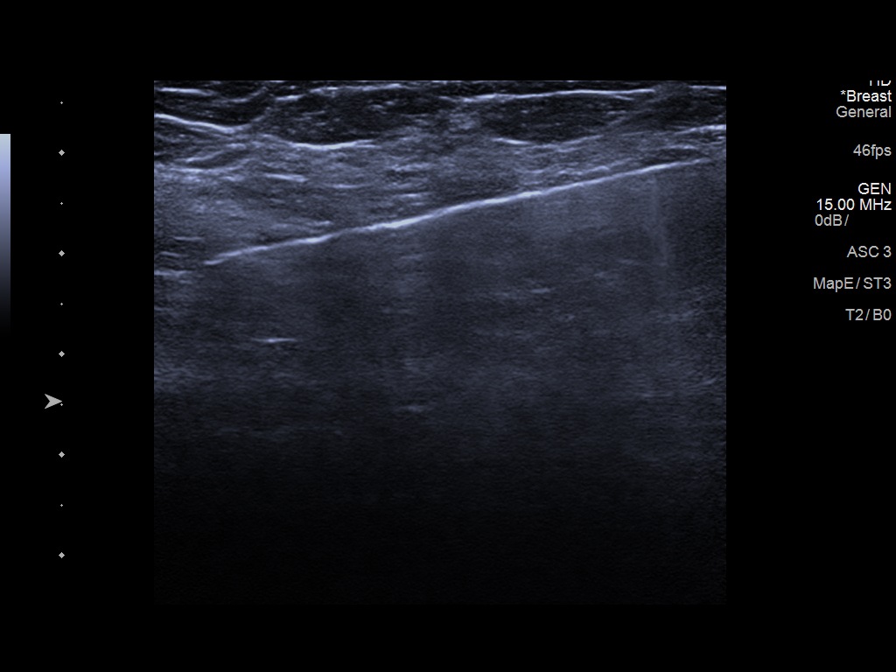
[im 8/12]
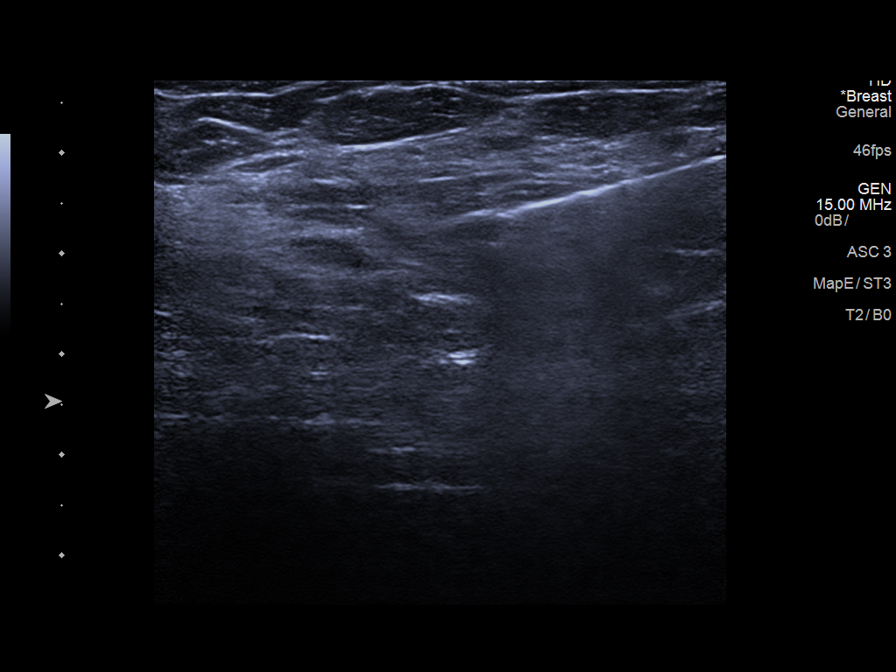
[im 10/12]
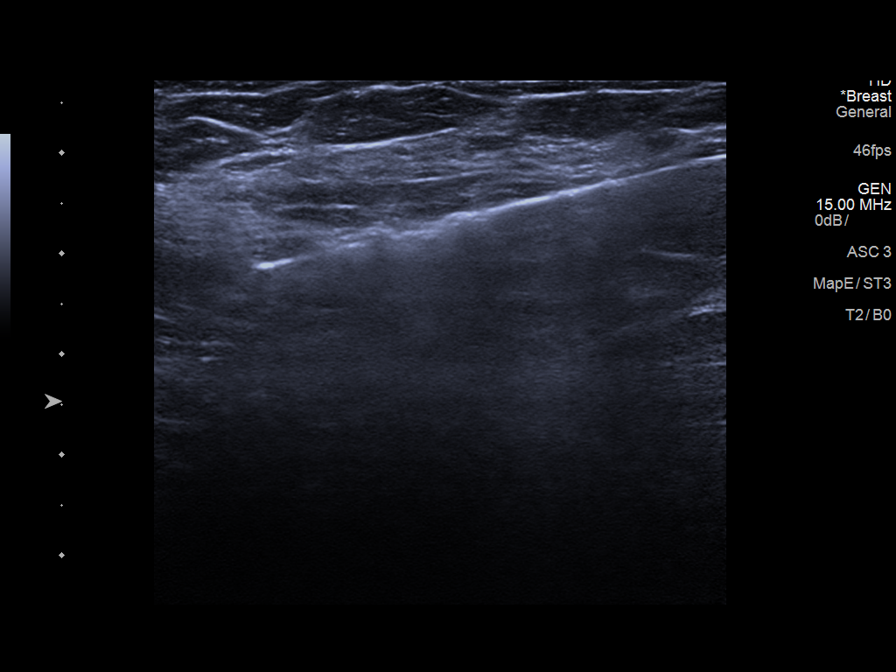
[im 12/12]
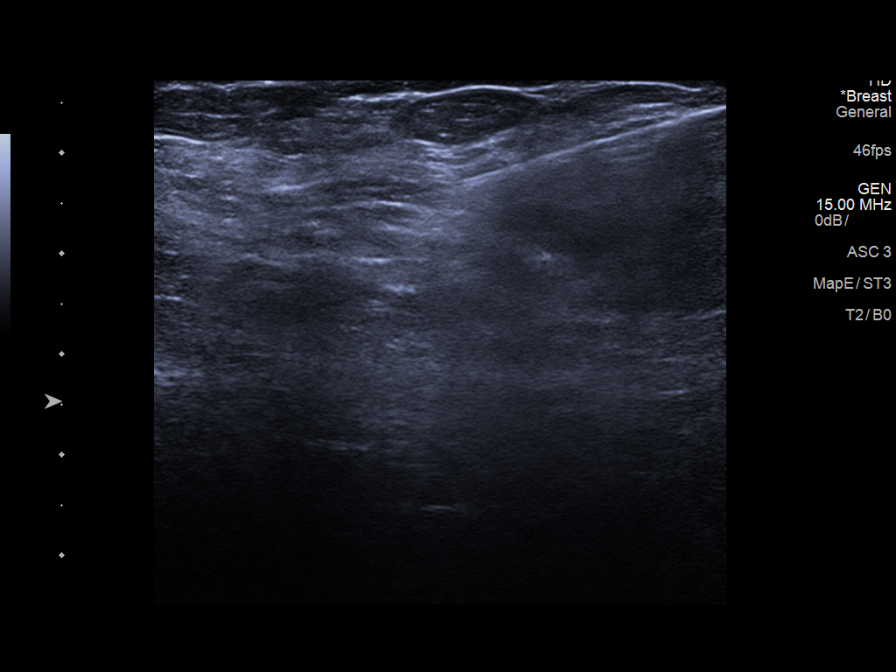

[8 of 8 positions shown; findings below may reference images not displayed]



Lesion quadrant: 3 o'clock

Using sterile technique and 1% Lidocaine as local anesthetic, under
direct ultrasound visualization, a 12 gauge Sorin device was
used to perform biopsy of the 3 o'clock left breast mass using a
medial approach. At the conclusion of the procedure venous shaped
tissue marker clip was deployed into the biopsy cavity. Follow up 2
view mammogram was performed and dictated separately.
IMPRESSION: Ultrasound guided biopsy of a 3 o'clock left breast mass. No
apparent complications.

ADDENDUM:
PATHOLOGY revealed: A. BREAST, LEFT [DATE] 6 CM FN; ULTRASOUND-GUIDED
BIOPSY: - CYSTIC PAPILLARY APOCRINE METAPLASIA WITH FOCAL REACTIVE
STROMAL CHANGES CONSISTENT WITH CYST RUPTURE. - NEGATIVE FOR ATYPIA
AND MALIGNANCY.

Comment: Sections demonstrate breast tissue with a focal cystic area
lined by benign mammary epithelium with apocrine metaplasia that is
focally arranged into small papillary structures. These histologic
findings are benign.

Pathology results are CONCORDANT with imaging findings, per Dr.
Adarsh Tiger.

Pathology results and recommendations below were discussed with
patient by telephone on 05/28/2019. Patient reported biopsy site doing
well with slight tenderness at the site. Post biopsy care
instructions were reviewed and questions were answered. Patient was
instructed to call [HOSPITAL] if any concerns or
questions arise related to the biopsy.

Recommendation: Patient instructed to return in three months for
unilateral LEFT axillary ultrasound to evaluate LEFT axillary lymph
nodes. Patient informed a reminder notice will be sent regarding
this appointment.

Addendum by Declan Stowers RN on 05/28/2019.



Lesion quadrant: 3 o'clock

Using sterile technique and 1% Lidocaine as local anesthetic, under
direct ultrasound visualization, a 12 gauge Sorin device was
used to perform biopsy of the 3 o'clock left breast mass using a
medial approach. At the conclusion of the procedure venous shaped
tissue marker clip was deployed into the biopsy cavity. Follow up 2
view mammogram was performed and dictated separately.
IMPRESSION: Ultrasound guided biopsy of a 3 o'clock left breast mass. No
apparent complications.

## 2020-12-06 IMAGING — US US BREAST*L* LIMITED INC AXILLA
1 series · 1 of 1 positions shown · non-contrast
Comparison: Previous exam(s).

CLINICAL DATA: Re-evaluate lymph nodes seen in the left axilla on
May 11, 2019. The patient had a [id number] vaccination 2 weeks
ago.

EXAM:
ULTRASOUND OF THE LEFT BREAST

[Series 1: us breast*left* limited inc axilla · 0.08mm/px · 1 of 1 slices shown]
[im 1/1]
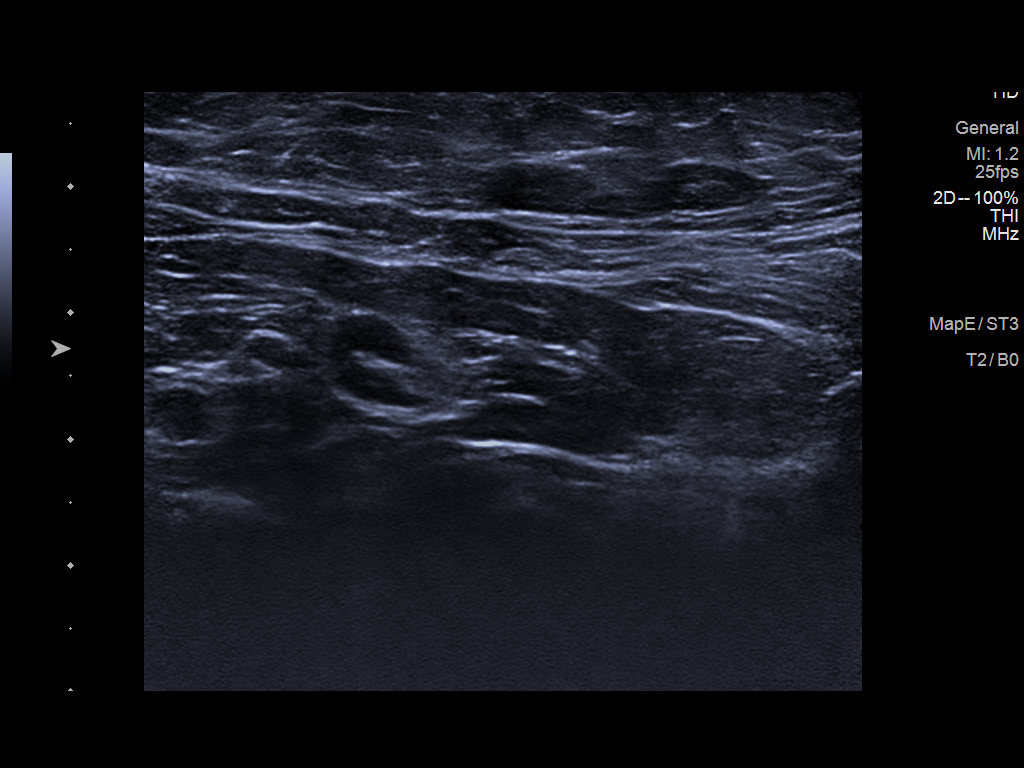

[1 of 1 positions shown; findings below may reference images not displayed]

FINDINGS: On physical exam, no suspicious lumps are identified.

Targeted ultrasound is performed, showing borderline nodes in the
left axilla. A representative node demonstrates a cortex measuring
3.7 mm. While I have no previous imaging for comparison, the
sonographer who scan the patient today and on May 11, 2019
indicates improvement.
IMPRESSION: Probably benign reactive lymph nodes in the left axilla.

RECOMMENDATION:
If the left breast mass biopsied today is benign, recommend 3 month
follow-up of the left axillary lymph nodes. If the left breast mass
biopsied today demonstrates a atypical or malignant pathology,
recommend biopsying the largest left axillary node prior to
lumpectomy.

I have discussed the findings and recommendations with the patient.
If applicable, a reminder letter will be sent to the patient
regarding the next appointment.

BI-RADS CATEGORY  3: Probably benign.

## 2021-05-20 ENCOUNTER — Other Ambulatory Visit: Payer: Self-pay | Admitting: Family Medicine

## 2021-05-20 DIAGNOSIS — Z1231 Encounter for screening mammogram for malignant neoplasm of breast: Secondary | ICD-10-CM

## 2021-09-17 ENCOUNTER — Ambulatory Visit: Payer: PRIVATE HEALTH INSURANCE
